# Patient Record
Sex: Female | Born: 1985 | Race: Black or African American | Hispanic: No | Marital: Single | Smoking: Current every day smoker
Health system: Southern US, Community
[De-identification: ages and names within clinical notes are randomized; demographics above are authoritative.]

## PROBLEM LIST (undated history)

## (undated) DIAGNOSIS — I10 Essential (primary) hypertension: Secondary | ICD-10-CM

---
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
## 2012-05-02 DIAGNOSIS — R0602 Shortness of breath: Secondary | ICD-10-CM

## 2017-11-17 ENCOUNTER — Emergency Department (HOSPITAL_COMMUNITY): Payer: Self-pay

## 2017-11-17 ENCOUNTER — Other Ambulatory Visit: Payer: Self-pay

## 2017-11-17 ENCOUNTER — Encounter (HOSPITAL_COMMUNITY): Payer: Self-pay

## 2017-11-17 ENCOUNTER — Emergency Department (HOSPITAL_COMMUNITY)
Admission: EM | Admit: 2017-11-17 | Discharge: 2017-11-17 | Disposition: A | Discharge status: Home / Self Care | Payer: Self-pay | Attending: Emergency Medicine | Admitting: Emergency Medicine

## 2017-11-17 DIAGNOSIS — R51 Headache: Secondary | ICD-10-CM | POA: Insufficient documentation

## 2017-11-17 DIAGNOSIS — I10 Essential (primary) hypertension: Secondary | ICD-10-CM | POA: Insufficient documentation

## 2017-11-17 DIAGNOSIS — F17210 Nicotine dependence, cigarettes, uncomplicated: Secondary | ICD-10-CM | POA: Insufficient documentation

## 2017-11-17 DIAGNOSIS — Y999 Unspecified external cause status: Secondary | ICD-10-CM | POA: Insufficient documentation

## 2017-11-17 DIAGNOSIS — Y9389 Activity, other specified: Secondary | ICD-10-CM | POA: Insufficient documentation

## 2017-11-17 DIAGNOSIS — R519 Headache, unspecified: Secondary | ICD-10-CM

## 2017-11-17 HISTORY — DX: Essential (primary) hypertension: I10

## 2017-11-17 NOTE — Discharge Instructions (Signed)
It is nice meeting you today! The CAT scan of your head is normal.  You may take Tylenol for up to every 6 hours as needed for your pain. Please follow-up with your primary care doctor.  We strongly recommend you cut down on smoking. Please come back to emergency department if you have worsening of your headache or other symptoms concerning to you.
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
## 2017-11-17 NOTE — ED Provider Notes (Signed)
Pt seen and evaluated. D/W Resident. Pt with normal exam, and normal CT scan.  No stigmata of skull fracture.  No blood over the TMs, mastoids, or from ears nose or mouth.  Symmetric intact cranial nerves.  Minimal abrasion to the front forehead.  Normal gait.  Normal neuro exam.  This discharged home.  Conservative treatment.  Expectant management.  Over-the-counter pain medications.  Postconcussive symptoms discussed, and given in written form.   Rolland PorterJames, Sarye Kath, MD 11/17/17 1100

## 2018-08-18 ENCOUNTER — Emergency Department (HOSPITAL_COMMUNITY)
Admission: EM | Admit: 2018-08-18 | Discharge: 2018-08-18 | Disposition: A | Discharge status: Home / Self Care | Payer: PRIVATE HEALTH INSURANCE | Attending: Emergency Medicine | Admitting: Emergency Medicine

## 2018-08-18 ENCOUNTER — Other Ambulatory Visit: Payer: Self-pay

## 2018-08-18 ENCOUNTER — Encounter (HOSPITAL_COMMUNITY): Payer: Self-pay | Admitting: *Deleted

## 2018-08-18 ENCOUNTER — Emergency Department (HOSPITAL_COMMUNITY): Payer: PRIVATE HEALTH INSURANCE

## 2018-08-18 DIAGNOSIS — M545 Low back pain: Secondary | ICD-10-CM | POA: Diagnosis present

## 2018-08-18 DIAGNOSIS — M5441 Lumbago with sciatica, right side: Secondary | ICD-10-CM | POA: Insufficient documentation

## 2018-08-18 DIAGNOSIS — I10 Essential (primary) hypertension: Secondary | ICD-10-CM | POA: Diagnosis not present

## 2018-08-18 DIAGNOSIS — F17200 Nicotine dependence, unspecified, uncomplicated: Secondary | ICD-10-CM | POA: Insufficient documentation

## 2018-08-18 LAB — POC URINE PREG, ED: Preg Test, Ur: NEGATIVE

## 2018-08-18 MED ORDER — METHOCARBAMOL 500 MG PO TABS: 500.0000 mg | ORAL_TABLET | Freq: Two times a day (BID) | ORAL | 0 refills | Status: AC

## 2018-08-18 MED ORDER — IBUPROFEN 600 MG PO TABS: 600.0000 mg | ORAL_TABLET | Freq: Four times a day (QID) | ORAL | 0 refills | Status: DC | PRN

## 2018-08-18 MED ORDER — METHOCARBAMOL 500 MG PO TABS: 500.0000 mg | ORAL_TABLET | Freq: Two times a day (BID) | ORAL | 0 refills | Status: DC

## 2018-08-18 MED ORDER — LIDOCAINE 5 % EX PTCH: 1.0000 | MEDICATED_PATCH | CUTANEOUS | 0 refills | Status: AC

## 2018-08-18 MED ORDER — KETOROLAC TROMETHAMINE 60 MG/2ML IM SOLN
60.0000 mg | Freq: Once | INTRAMUSCULAR | Status: AC
  Administered 2018-08-18: 60 mg via INTRAMUSCULAR
  Filled 2018-08-18: qty 2

## 2018-08-18 MED ORDER — HYDROCODONE-ACETAMINOPHEN 5-325 MG PO TABS: 1.0000 | ORAL_TABLET | Freq: Four times a day (QID) | ORAL | 0 refills | Status: AC | PRN

## 2018-08-18 MED ORDER — LIDOCAINE 5 % EX PTCH: 1.0000 | MEDICATED_PATCH | CUTANEOUS | 0 refills | Status: DC

## 2018-08-18 MED ORDER — IBUPROFEN 600 MG PO TABS: 600.0000 mg | ORAL_TABLET | Freq: Four times a day (QID) | ORAL | 0 refills | Status: AC | PRN

## 2018-08-18 NOTE — ED Triage Notes (Signed)
Pt states she woke up and could not move.  Pt went to work and just could hardly move.  Complains of pain to lower mid back and radiates to right and down to right hip.  Pt has a pin to right hip.

## 2018-08-18 NOTE — ED Provider Notes (Signed)
[facility] Provider Note   CSN: [id number] Arrival date & time: 08/18/18  [id number]     History   Chief Complaint Chief Complaint  Patient presents with  . Back Pain    HPI Tricia Henderson is a 32 y.o. female.  HPI   Tricia Henderson is a 32 y.o. female, with a history of HTN, presenting to the ED with right lower back pain beginning this morning.  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  She woke up with pain in the right lower back radiating to the right hip, described as a stiffness, moderate to severe.  She has not taken anything for her symptoms this morning. States she got up and went to work, but decided she wanted to be evaluated.  Patient mentions that she has a pain in the right hip.  She states she had some right hip laxity from dancing for many years and a pin was placed to stabilize the right hip.  Patient is currently menstruating, starting yesterday.  Denies fever/chills, abdominal pain, N/V/C/D, numbness, weakness, urinary symptoms, falls/trauma, or any other complaints.    Past Medical History:  Diagnosis Date  . Hypertension     There are no active problems to display for this patient.   History reviewed. No pertinent surgical history.   OB History   No obstetric history on file.      Home Medications    Prior to Admission medications   Medication Sig Start Date End Date Taking? Authorizing Provider  HYDROcodone-acetaminophen (NORCO/VICODIN) 5-325 MG tablet Take 1 tablet by mouth every 6 (six) hours as needed for severe pain. 08/18/18   Joy, Shawn C, PA-C  ibuprofen (ADVIL,MOTRIN) 600 MG tablet Take 1 tablet (600 mg total) by mouth every 6 (six) hours as needed. 08/18/18   Joy, Shawn C, PA-C  lidocaine (LIDODERM) 5 % Place 1 patch onto the skin daily. Remove & Discard patch within 12 hours or as directed by MD 08/18/18   Joy, Shawn C, PA-C  methocarbamol  (ROBAXIN) 500 MG tablet Take 1 tablet (500 mg total) by mouth 2 (two) times daily. 08/18/18   Anselm PancoastJoy, Shawn C, PA-C    Family History No family history on file.  Social History Social History   Tobacco Use  . Smoking status: Current Every Day Smoker  . Smokeless tobacco: Never Used  Substance Use Topics  . Alcohol use: Yes  . Drug use: Yes    Types: Marijuana     Allergies   Patient has no known allergies.   Review of Systems Review of Systems  Constitutional: Negative for chills, diaphoresis and fever.  Respiratory: Negative for shortness of breath.   Cardiovascular: Negative for chest pain.  Gastrointestinal: Negative for abdominal pain, constipation, diarrhea, nausea and vomiting.  Genitourinary: Negative for dysuria, flank pain, frequency, hematuria and pelvic pain.  Musculoskeletal: Positive for back pain.  Neurological: Negative for weakness and numbness.  All other systems reviewed and are negative.    Physical Exam Updated Vital Signs BP (!) 119/48 (BP Location: Right Arm)   Pulse 71   Temp 98.3 F (36.8 C) (Oral)   Resp 14   Ht 5\' 5"  (1.651 m)   Wt 104.3 kg   LMP 08/17/2018 (Exact Date)   SpO2 99%   BMI 38.27 kg/m   Physical Exam Vitals signs and nursing note reviewed.  Constitutional:      General: She is not  in acute distress.    Appearance: She is well-developed. She is not diaphoretic.  HENT:     Head: Normocephalic and atraumatic.  Eyes:     Conjunctiva/sclera: Conjunctivae normal.  Neck:     Musculoskeletal: Neck supple.  Cardiovascular:     Rate and Rhythm: Normal rate and regular rhythm.     Pulses:          Posterior tibial pulses are 2+ on the right side and 2+ on the left side.  Pulmonary:     Effort: Pulmonary effort is normal. No respiratory distress.  Abdominal:     Palpations: Abdomen is soft.     Tenderness: There is no abdominal tenderness. There is no guarding.  Musculoskeletal:       Back:     Comments: Tenderness  throughout the musculature in the regions indicated. Patient has pain with range of motion of the right hip, but no noted deformity, laxity, swelling, or instability.  Lymphadenopathy:     Cervical: No cervical adenopathy.  Skin:    General: Skin is warm and dry.  Neurological:     Mental Status: She is alert.     Comments: Sensation grossly intact to light touch in the lower extremities bilaterally. No saddle anesthesias. Strength 5/5 with flexion and extension at the bilateral hips, knees, and ankles. Slow, antalgic gait, however, ambulates independently without assistance. Coordination intact with heel to shin testing.  Psychiatric:        Behavior: Behavior normal.      ED Treatments / Results  Labs (all labs ordered are listed, but only abnormal results are displayed) Labs Reviewed  POC URINE PREG, ED    EKG None  Radiology Dg Thoracic Spine 2 View  Result Date: 08/18/2018 CLINICAL DATA:  Acute upper back pain without known injury. EXAM: THORACIC SPINE 2 VIEWS COMPARISON:  CT scan of April 10, 2015. FINDINGS: There is no evidence of thoracic spine fracture. Alignment is normal. No other significant bone abnormalities are identified. IMPRESSION: Negative. Electronically Signed   By: Lupita Raider, M.D.   On: 08/18/2018 11:42   Dg Lumbar Spine Complete  Result Date: 08/18/2018 CLINICAL DATA:  Acute lower back pain without known injury. EXAM: LUMBAR SPINE - COMPLETE 4+ VIEW COMPARISON:  CT scan of February 14, 2016. FINDINGS: There is no evidence of lumbar spine fracture. Alignment is normal. Mild disc space narrowing of L4-5 is noted suggesting degenerative disc disease. 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 No acute abnormality seen in the lumbar spine. Electronically Signed   By: Lupita Raider, M.D.   On: 08/18/2018 11:41   Dg Hip Unilat W Or Wo Pelvis 2-3 Views Right  Result Date: 08/18/2018 CLINICAL DATA:  Right hip pain without recent injury. EXAM: DG  HIP (WITH OR WITHOUT PELVIS) 2-3V RIGHT COMPARISON:  None. FINDINGS: Status post surgical internal fixation of old right hip fracture. No acute fracture or dislocation is noted. Joint spaces intact. No significant degenerative changes noted. IMPRESSION: Postsurgical changes seen in right hip. No acute abnormality is noted. Electronically Signed   By: Lupita Raider, M.D.   On: 08/18/2018 11:39    Procedures Procedures (including critical care time)  Medications Ordered in ED Medications  ketorolac (TORADOL) injection 60 mg (60 mg Intramuscular Given 08/18/18 1016)     Initial Impression / Assessment and Plan / ED Course  I have reviewed the triage vital signs and the nursing notes.  Pertinent labs & imaging results that were  available during my care of the patient were reviewed by me and considered in my medical decision making (see chart for details).     Patient presents with lower back pain.  Neurovascularly intact.  No red flag symptoms.  No acute abnormalities on imaging. The patient was given instructions for home care as well as return precautions. Patient voices understanding of these instructions, accepts the plan, and is comfortable with discharge.    Final Clinical Impressions(s) / ED Diagnoses   Final diagnoses:  Acute right-sided low back pain with right-sided sciatica    ED Discharge Orders         Ordered    methocarbamol (ROBAXIN) 500 MG tablet  2 times daily     08/18/18 1010    lidocaine (LIDODERM) 5 %  Every 24 hours     08/18/18 1010    HYDROcodone-acetaminophen (NORCO/VICODIN) 5-325 MG tablet  Every 6 hours PRN     08/18/18 1010    ibuprofen (ADVIL,MOTRIN) 600 MG tablet  Every 6 hours PRN     08/18/18 1010           Anselm Pancoast, PA-C 08/18/18 1222    Wynetta Fines, MD 08/18/18 1642

## 2018-08-18 NOTE — Discharge Instructions (Addendum)
There were no acute abnormalities noted on x-rays, however, there were mild degenerative changes noted in the lumbar spine.  Expect your soreness to increase over the next 2-3 days. Take it easy, but do not lay around too much as this may make any stiffness worse.  Antiinflammatory medications: Take 600 mg of ibuprofen every 6 hours or 440 mg (over the counter dose) to 500 mg (prescription dose) of naproxen every 12 hours for the next 3 days. After this time, these medications may be used as needed for pain. Take these medications with food to avoid upset stomach. Choose only one of these medications, do not take them together. Acetaminophen (generic for Tylenol): Should you continue to have additional pain while taking the ibuprofen or naproxen, you may add in acetaminophen as needed. Your daily total maximum amount of acetaminophen from all sources should be limited to 4000mg /day for persons without liver problems, or 2000mg /day for those with liver problems. Vicodin: May take Vicodin (hydrocodone-acetaminophen) as needed for severe pain.  Do not drive or perform other dangerous activities while taking the Vicodin.  Please note that each pill of Vicodin contains 325 mg of acetaminophen (Tylenol) and the above dosage limits apply. Muscle relaxer: Robaxin is a muscle relaxer and may help loosen stiff muscles. Do not take the Robaxin while driving or performing other dangerous activities.  Lidocaine patches: These are available via either prescription or over-the-counter. The over-the-counter option may be more economical one and are likely just as effective. There are multiple over-the-counter brands, such as Salonpas. Exercises: Be sure to perform the attached exercises starting with three times a week and working up to performing them daily. This is an essential part of preventing long term problems.  Follow up: Follow up with a primary care provider for any future management of these complaints. Be sure to  follow up within 7-10 days.  May also need to follow-up with an orthopedist. Return: Return to the ED should symptoms worsen.  For prescription assistance, may try using prescription discount sites or apps, such as goodrx.com

## 2018-08-18 NOTE — ED Notes (Signed)
Pt stable, ambulatory, states understanding of discharge instructions 

## 2019-02-23 IMAGING — DX DG THORACIC SPINE 2V
2 series · 2 of 2 positions shown · non-contrast
Comparison: CT scan of April 10, 2015.

CLINICAL DATA: Acute upper back pain without known injury.

EXAM:
THORACIC SPINE 2 VIEWS

[t thoracic spine lat]
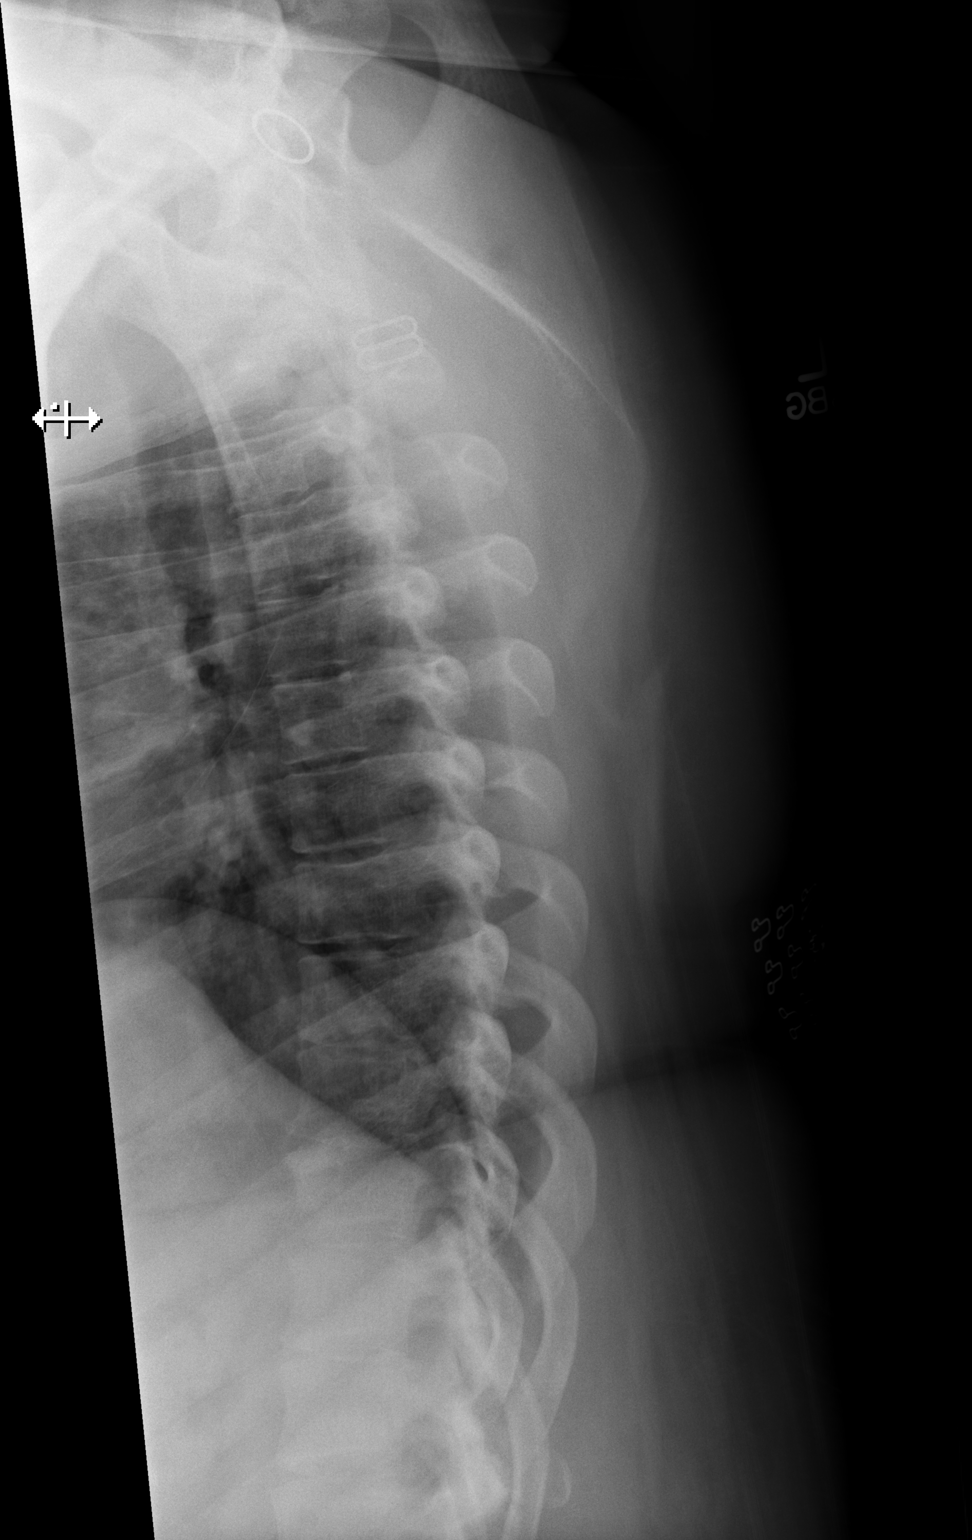

[t thoracic spine ap]
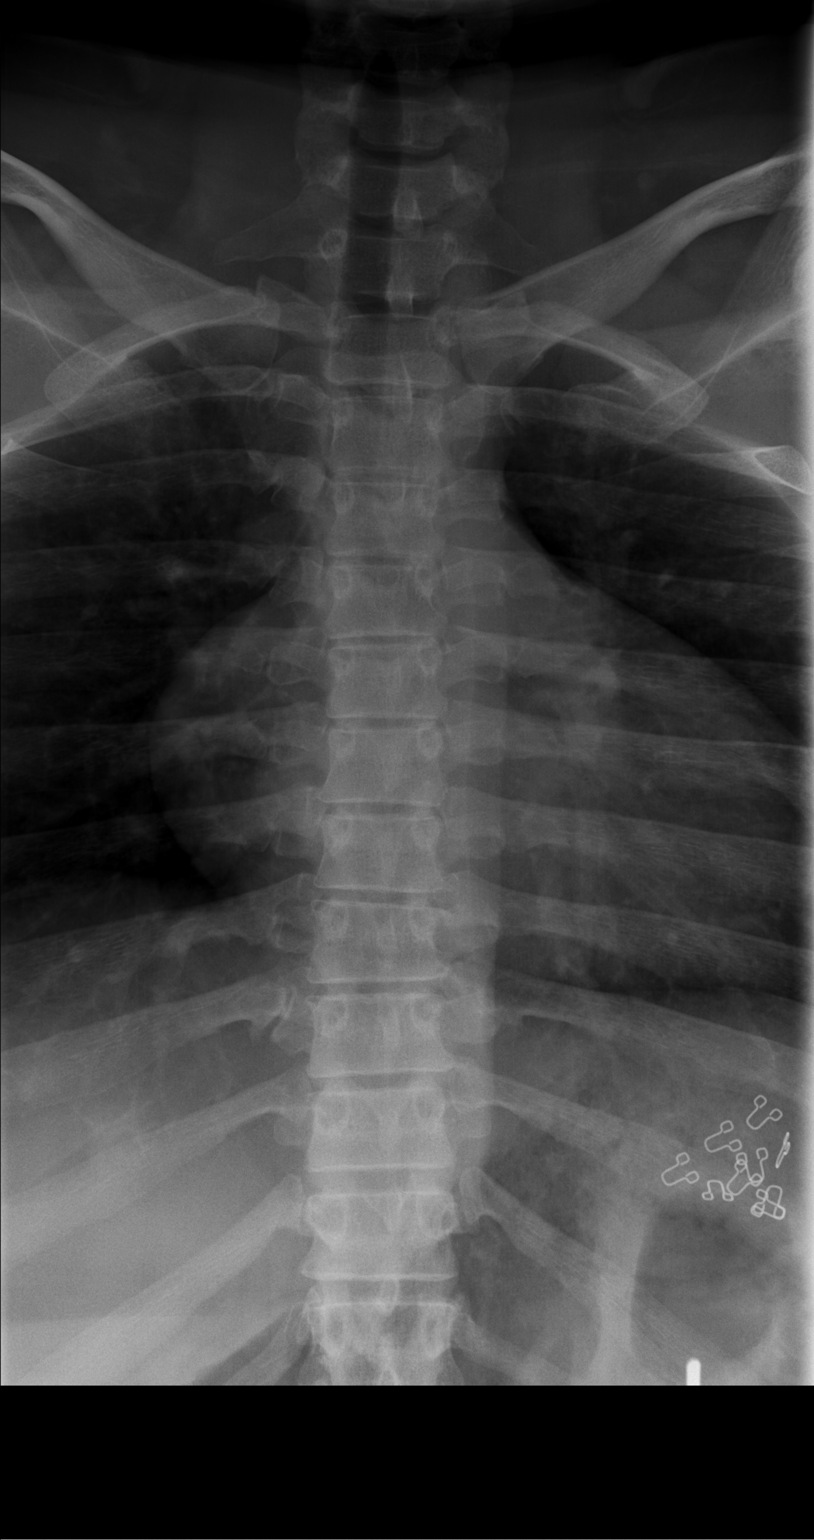

[2 of 2 positions shown; findings below may reference images not displayed]

FINDINGS: There is no evidence of thoracic spine fracture. Alignment is
normal. No other significant bone abnormalities are identified.
IMPRESSION: Negative.

## 2019-02-23 IMAGING — DX DG LUMBAR SPINE COMPLETE 4+V
5 series · 5 of 5 positions shown · non-contrast
Comparison: CT scan of February 14, 2016.

CLINICAL DATA: Acute lower back pain without known injury.

EXAM:
LUMBAR SPINE - COMPLETE 4+ VIEW

[t lumbar spine ap]
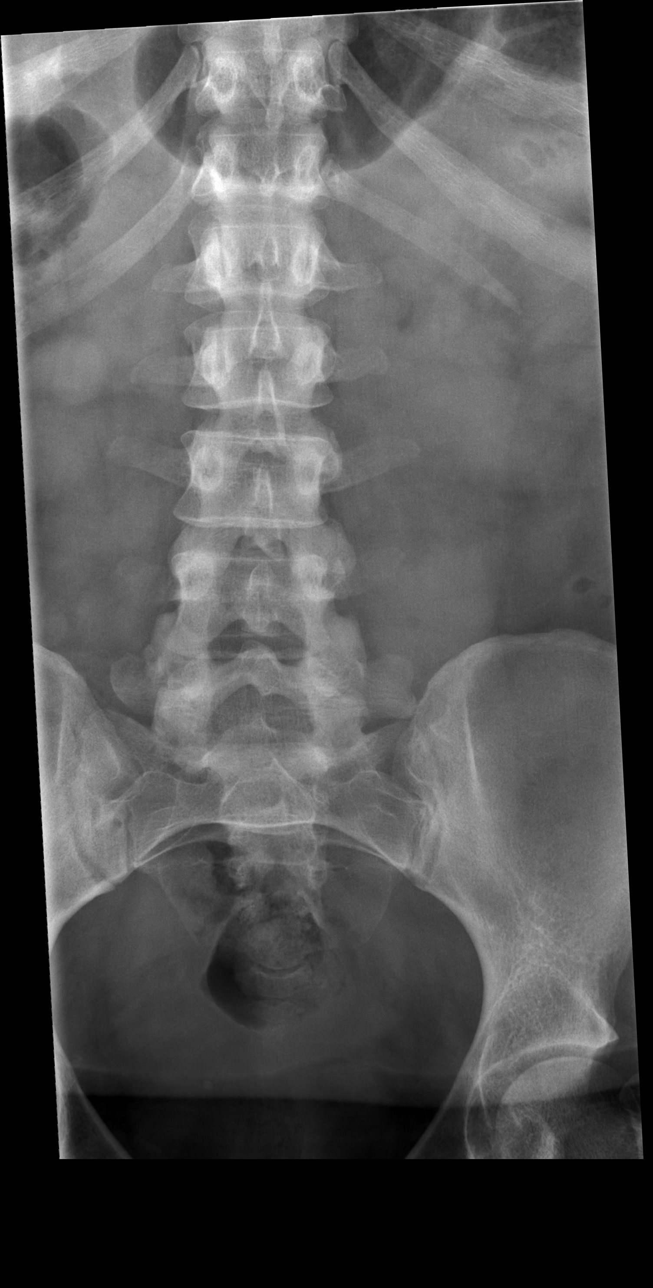

[t lumbar spine obl (1 of 2)]
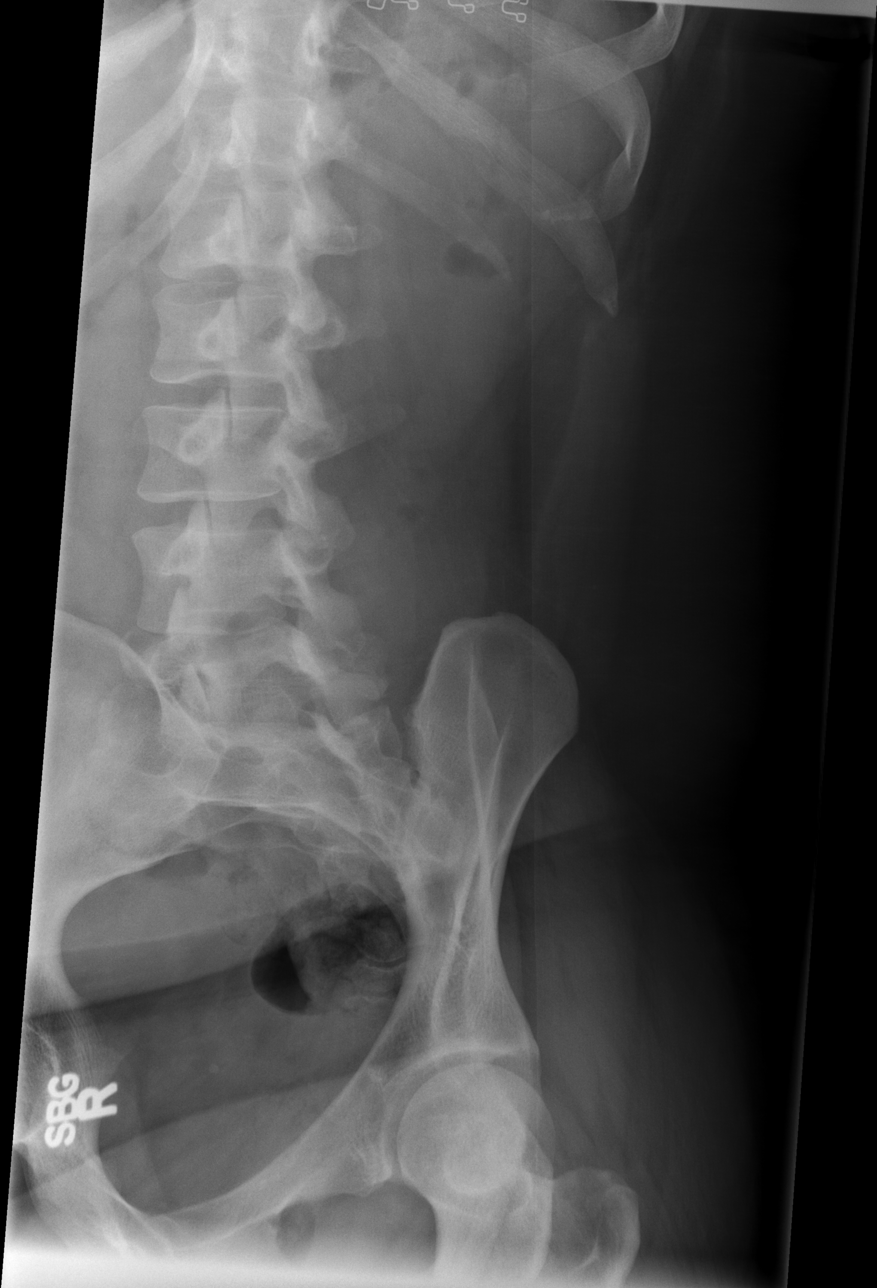

[t lumbar spine obl (2 of 2)]
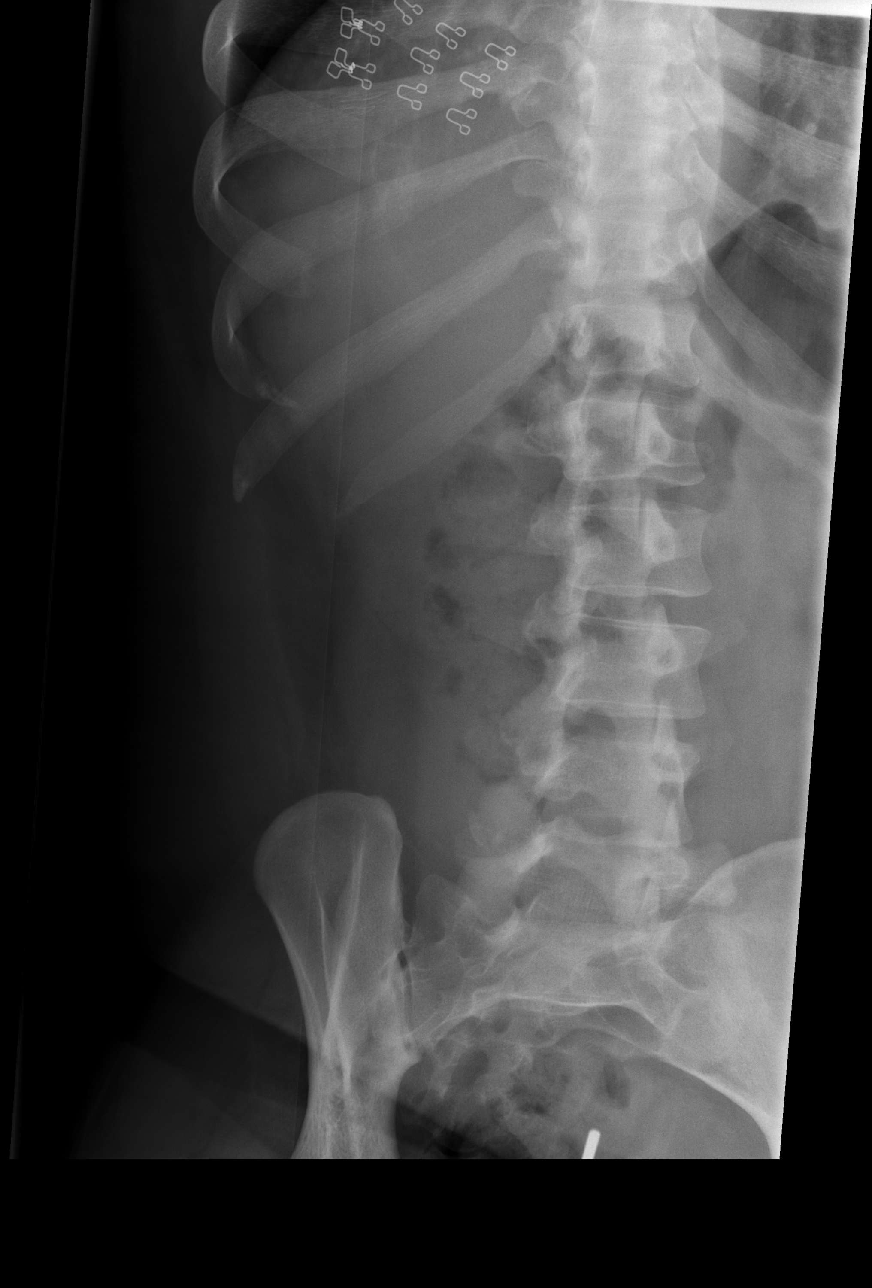

[t lumbar spine lat]
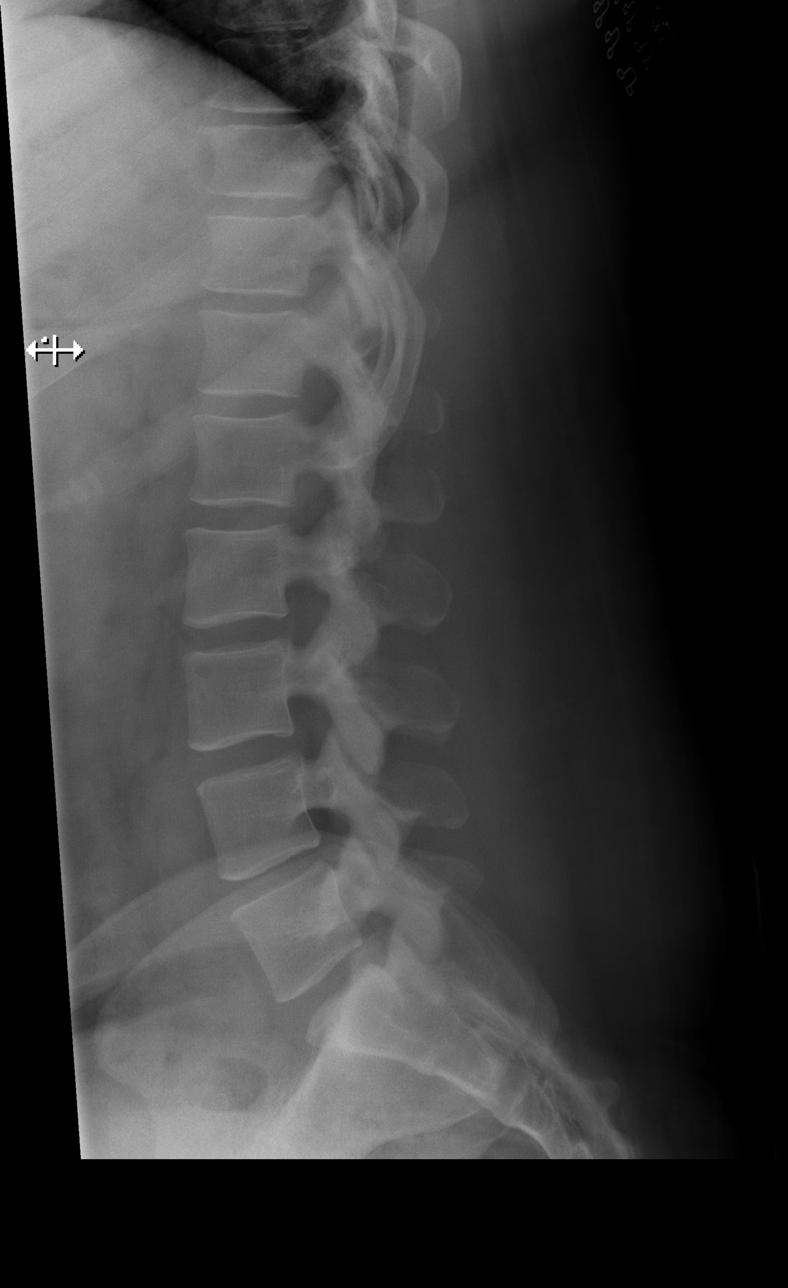

[t lumbar l-5 s-1 spot]
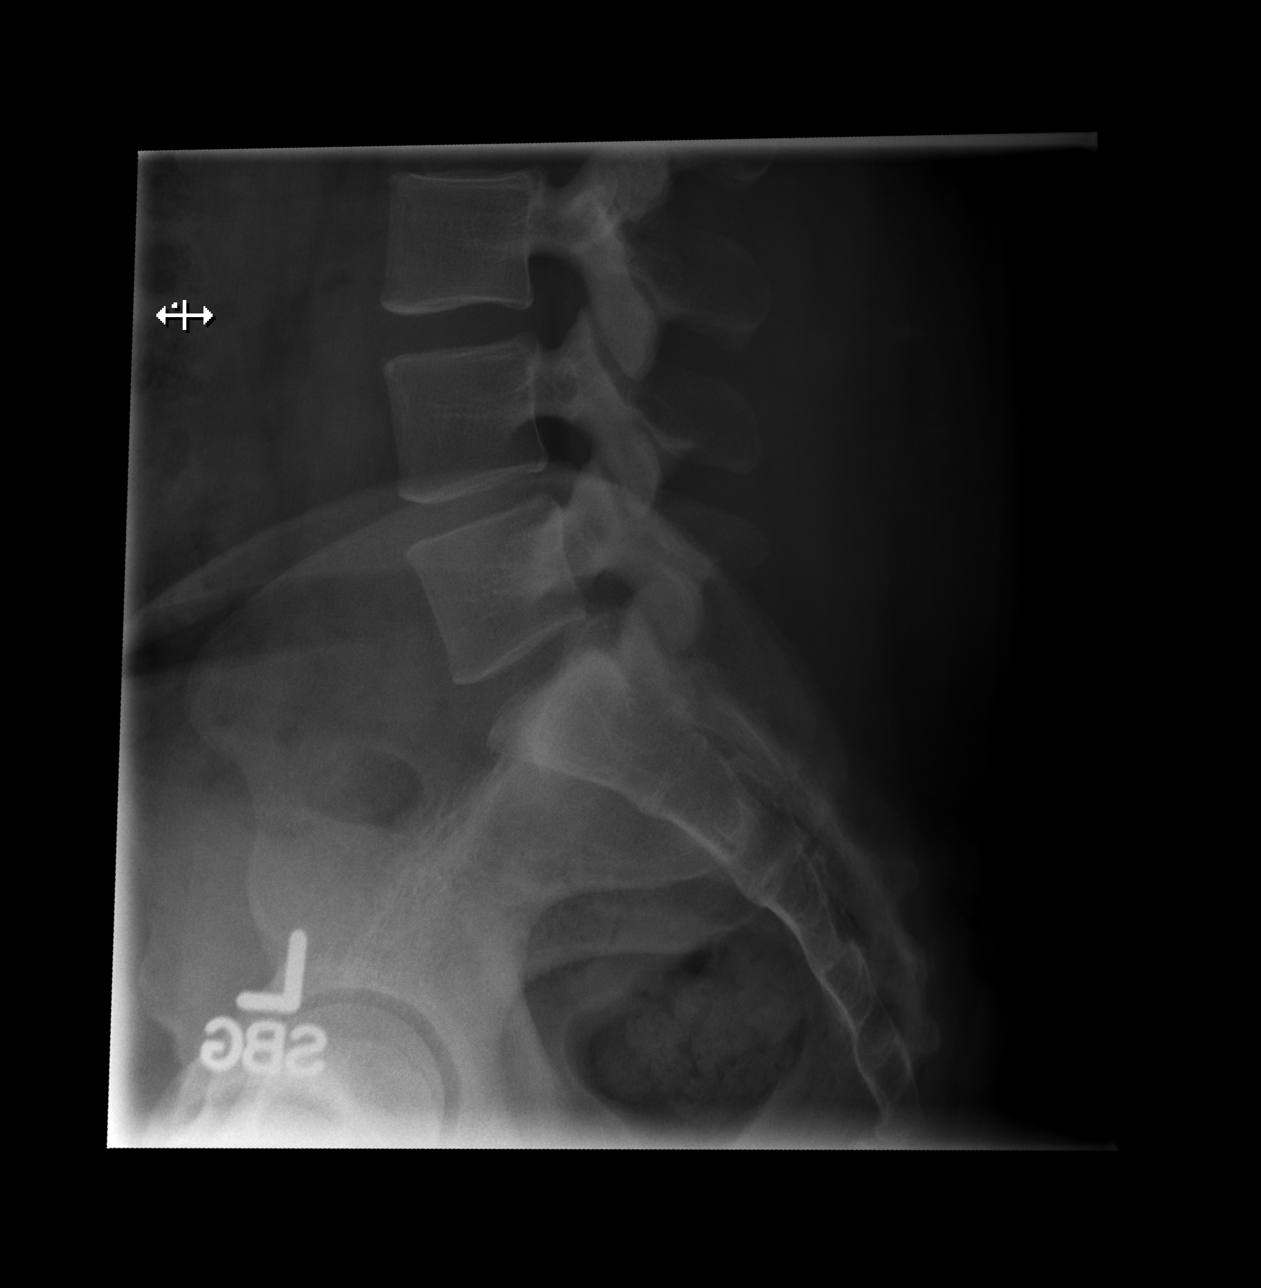

[5 of 5 positions shown; findings below may reference images not displayed]

FINDINGS: There is no evidence of lumbar spine fracture. Alignment is normal.
Mild disc space narrowing of L4-5 is noted suggesting degenerative
disc disease.
IMPRESSION: Mild degenerative disc disease of L4-5. No acute abnormality seen in
the lumbar spine.

## 2021-12-07 DIAGNOSIS — X503XXA Overexertion from repetitive movements, initial encounter: Secondary | ICD-10-CM | POA: Diagnosis not present

## 2021-12-07 DIAGNOSIS — Y990 Civilian activity done for income or pay: Secondary | ICD-10-CM | POA: Diagnosis not present

## 2021-12-07 DIAGNOSIS — S39012A Strain of muscle, fascia and tendon of lower back, initial encounter: Secondary | ICD-10-CM | POA: Diagnosis not present

## 2021-12-07 DIAGNOSIS — M545 Low back pain, unspecified: Secondary | ICD-10-CM | POA: Diagnosis not present

## 2021-12-07 DIAGNOSIS — F17210 Nicotine dependence, cigarettes, uncomplicated: Secondary | ICD-10-CM | POA: Diagnosis not present

## 2021-12-07 DIAGNOSIS — R030 Elevated blood-pressure reading, without diagnosis of hypertension: Secondary | ICD-10-CM | POA: Diagnosis not present

## 2021-12-19 DIAGNOSIS — I10 Essential (primary) hypertension: Secondary | ICD-10-CM | POA: Diagnosis not present

## 2021-12-19 DIAGNOSIS — M5442 Lumbago with sciatica, left side: Secondary | ICD-10-CM | POA: Diagnosis not present

## 2021-12-19 DIAGNOSIS — E6609 Other obesity due to excess calories: Secondary | ICD-10-CM | POA: Diagnosis not present

## 2021-12-19 DIAGNOSIS — E782 Mixed hyperlipidemia: Secondary | ICD-10-CM | POA: Diagnosis not present

## 2022-03-20 DIAGNOSIS — Z6835 Body mass index (BMI) 35.0-35.9, adult: Secondary | ICD-10-CM | POA: Diagnosis not present

## 2022-03-20 DIAGNOSIS — Z0000 Encounter for general adult medical examination without abnormal findings: Secondary | ICD-10-CM | POA: Diagnosis not present

## 2022-03-26 DIAGNOSIS — S0993XA Unspecified injury of face, initial encounter: Secondary | ICD-10-CM | POA: Diagnosis not present

## 2022-03-26 DIAGNOSIS — S0990XA Unspecified injury of head, initial encounter: Secondary | ICD-10-CM | POA: Diagnosis not present

## 2022-03-30 DIAGNOSIS — Z4802 Encounter for removal of sutures: Secondary | ICD-10-CM | POA: Diagnosis not present

## 2022-04-14 ENCOUNTER — Other Ambulatory Visit: Payer: Self-pay

## 2022-04-14 ENCOUNTER — Emergency Department (HOSPITAL_COMMUNITY): Payer: BC Managed Care – PPO

## 2022-04-14 ENCOUNTER — Emergency Department (HOSPITAL_COMMUNITY)
Admission: EM | Admit: 2022-04-14 | Discharge: 2022-04-14 | Disposition: A | Discharge status: Home / Self Care | Payer: BC Managed Care – PPO | Attending: Emergency Medicine | Admitting: Emergency Medicine

## 2022-04-14 ENCOUNTER — Encounter (HOSPITAL_COMMUNITY): Payer: Self-pay | Admitting: *Deleted

## 2022-04-14 DIAGNOSIS — I493 Ventricular premature depolarization: Secondary | ICD-10-CM | POA: Insufficient documentation

## 2022-04-14 DIAGNOSIS — R002 Palpitations: Secondary | ICD-10-CM | POA: Diagnosis present

## 2022-04-14 LAB — BASIC METABOLIC PANEL
Anion gap: 7 (ref 5–15)
BUN: 7 mg/dL (ref 6–20)
CO2: 24 mmol/L (ref 22–32)
Calcium: 9 mg/dL (ref 8.9–10.3)
Chloride: 112 mmol/L — ABNORMAL HIGH (ref 98–111)
Creatinine, Ser: 0.86 mg/dL (ref 0.44–1.00)
GFR, Estimated: >60 mL/min (ref >60)
Glucose, Bld: 88 mg/dL (ref 70–99)
Potassium: 3.9 mmol/L (ref 3.5–5.1)
Sodium: 143 mmol/L (ref 135–145)

## 2022-04-14 LAB — CBC
HCT: 36.3 % (ref 36.0–46.0)
Hemoglobin: 12.1 g/dL (ref 12.0–15.0)
MCH: 30 pg (ref 26.0–34.0)
MCHC: 33.3 g/dL (ref 30.0–36.0)
MCV: 90.1 fL (ref 80.0–100.0)
Platelets: 364 10*3/uL (ref 150–400)
RBC: 4.03 MIL/uL (ref 3.87–5.11)
RDW: 12.5 % (ref 11.5–15.5)
WBC: 8 10*3/uL (ref 4.0–10.5)
nRBC: 0 % (ref 0.0–0.2)

## 2022-04-14 LAB — TROPONIN I (HIGH SENSITIVITY)
Troponin I (High Sensitivity): 4 ng/L (ref <18)
Troponin I (High Sensitivity): 4 ng/L (ref <18)

## 2022-04-14 NOTE — ED Triage Notes (Signed)
The pt has been feeling weird today  irregular any way but its worse today   no pain  lmp  3 weeks ago

## 2022-04-14 NOTE — ED Provider Triage Note (Signed)
Emergency Medicine Provider Triage Evaluation Note  Tricia Henderson , a 36 y.o. female  was evaluated in triage.  Pt complains of chest palpitations.  Patient reports that she has a history of chest palpitations, has never seen cardiology.  The patient reports that the last 2 days these palpitations have increased in frequency.  The patient states that when they occur, it causes her to become short of breath and anxious.  The patient reports that she has not had any kind of syncopal events, denies any lightheadedness or dizziness.  Patient does have audible arrhythmia on auscultation.  Review of Systems  Positive:  Negative:   Physical Exam  BP (!) 141/98   Pulse (!) 109   Temp 98.7 F (37.1 C) (Oral)   Resp 18   Ht 5\' 5"  (1.651 m)   Wt 104.3 kg   LMP 03/23/2022   SpO2 99%   BMI 38.26 kg/m  Gen:   Awake, no distress   Resp:  Normal effort  MSK:   Moves extremities without difficulty  Other:  Lung sounds are bilaterally.  Audible arrhythmia  Medical Decision Making  Medically screening exam initiated at 4:54 PM.  Appropriate orders placed.  Jasmaine Hedges was informed that the remainder of the evaluation will be completed by another provider, this initial triage assessment does not replace that evaluation, and the importance of remaining in the ED until their evaluation is complete.     03/25/2022, PA-C 04/14/22 1655

## 2022-04-14 NOTE — Discharge Instructions (Addendum)
Your palpitations are likely what are call PVC, premature beats.  This can be exacerbated by caffeine use and tobacco use.  I do recommend cutting back on your caffeine use. If you are still having symptoms please let your primary care provider.  I have also placed a consult to cardiology to get Holter monitor. They should call to schedule an appointment. Please let your primary care doctor know you were seen here in the emergency department on Monday.  Return for new or worsening symptoms.

## 2022-04-14 NOTE — ED Provider Notes (Signed)
[facility] EMERGENCY DEPARTMENT Provider Note   CSN: [id number] Arrival date & time: 04/14/22  1623    History  Chief Complaint  Patient presents with   Chest Pain    Tricia Henderson is a 36 y.o. female for evaluation of palpitations.  Patient states this has been going on for the past few years however have worsened over the past few weeks.  Feels like her heart races and possibly skips beats. No CP, SOB, LE swelling.  No surgery, immobilization or malignancy.  No known thyroid problems.  Does drink large amount of caffeine on a daily basis of [facility] and coffee.  She has no current symptoms.  No fever, emesis, back pain, nausea or vomiting.  HPI     Home Medications Prior to Admission medications   Medication Sig Start Date End Date Taking? Authorizing Provider  HYDROcodone-acetaminophen (NORCO/VICODIN) 5-325 MG tablet Take 1 tablet by mouth every 6 (six) hours as needed for severe pain. 08/18/18   Joy, Shawn C, PA-C  ibuprofen (ADVIL,MOTRIN) 600 MG tablet Take 1 tablet (600 mg total) by mouth every 6 (six) hours as needed. 08/18/18   Joy, Shawn C, PA-C  lidocaine (LIDODERM) 5 % Place 1 patch onto the skin daily. Remove & Discard patch within 12 hours or as directed by MD 08/18/18   Joy, Shawn C, PA-C  methocarbamol (ROBAXIN) 500 MG tablet Take 1 tablet (500 mg total) by mouth 2 (two) times daily. 08/18/18   Joy, Hillard Danker, PA-C      Allergies    Patient has no known allergies.    Review of Systems   Review of Systems  Constitutional: Negative.   HENT: Negative.    Respiratory: Negative.    Cardiovascular:  Positive for palpitations. Negative for chest pain and leg swelling.  Gastrointestinal: Negative.   Genitourinary: Negative.   Musculoskeletal: Negative.   Skin: Negative.   Neurological: Negative.   All other systems reviewed and are negative.   Physical Exam Updated Vital Signs BP 122/68 (BP Location: Left Arm)   Pulse 79   Temp 99.4 F (37.4  C) (Oral)   Resp 16   Ht 5\' 5"  (1.651 m)   Wt 104.3 kg   LMP 03/23/2022   SpO2 100%   BMI 38.26 kg/m  Physical Exam Vitals and nursing note reviewed.  Constitutional:      General: She is not in acute distress.    Appearance: She is well-developed. She is obese. She is not ill-appearing, toxic-appearing or diaphoretic.  HENT:     Head: Normocephalic and atraumatic.  Eyes:     Pupils: Pupils are equal, round, and reactive to light.  Cardiovascular:     Rate and Rhythm: Normal rate.     Pulses:          Radial pulses are 2+ on the right side and 2+ on the left side.       Dorsalis pedis pulses are 2+ on the right side and 2+ on the left side.     Heart sounds: Normal heart sounds.  Pulmonary:     Effort: Pulmonary effort is normal. No respiratory distress.     Breath sounds: Normal breath sounds.     Comments: Clear bilaterally, speaks in full sentences without difficulty Chest:     Comments: Non tender chest wall Abdominal:     General: Bowel sounds are normal. There is no distension.     Palpations: Abdomen is soft.     Comments:  Soft, non tender without rebound or guarding  Musculoskeletal:        General: Normal range of motion.     Cervical back: Normal range of motion.     Right lower leg: No tenderness. No edema.     Left lower leg: No tenderness. No edema.     Comments: No bony tenderness, compartments soft. Homans neg  Skin:    General: Skin is warm and dry.     Capillary Refill: Capillary refill takes less than 2 seconds.     Comments: No erythema, warmth  Neurological:     General: No focal deficit present.     Mental Status: She is alert and oriented to person, place, and time.     Comments: Ambulatory Intact sensation  Psychiatric:        Mood and Affect: Mood normal.    ED Results / Procedures / Treatments   Labs (all labs ordered are listed, but only abnormal results are displayed) Labs Reviewed  BASIC METABOLIC PANEL - Abnormal; Notable for the  following components:      Result Value   Chloride 112 (*)    All other components within normal limits  CBC  TSH  TROPONIN I (HIGH SENSITIVITY)  TROPONIN I (HIGH SENSITIVITY)    EKG EKG Interpretation  Date/Time:  Friday April 14 2022 16:50:07 EDT Ventricular Rate:  110 PR Interval:  132 QRS Duration: 76 QT Interval:  326 QTC Calculation: 441 R Axis:   71 Text Interpretation: Sinus tachycardia with frequent and consecutive Premature ventricular complexes Abnormal ECG No previous ECGs available Confirmed by Dorie Rank [phone]) on 04/14/2022 10:19:44 PM  Radiology DG Chest 1 View  Result Date: 04/14/2022 CLINICAL DATA:  Heart beating fast. EXAM: CHEST  1 VIEW COMPARISON:  None Available. FINDINGS: The heart size and mediastinal contours are within normal limits. Both lungs are clear. The visualized skeletal structures are unremarkable. IMPRESSION: No active disease. Electronically Signed   By: Ronney Asters M.D.   On: 04/14/2022 17:19    Procedures Procedures    Medications Ordered in ED Medications - No data to display  ED Course/ Medical Decision Making/ A&P Clinical Course as of 04/14/22 2309  Fri Apr 14, 2022  2218 CBC [JK]  2218 Normal [JK]  2218 Troponin I (High Sensitivity) Normal [JK]  2218 DG Chest 1 View No acute abnormalities [JK]    Clinical Course User Index [JK] Dorie Rank, MD    36 year old here for evaluation of palpitations.  Sounds like these have been ongoing for quite some time however worse over the last week.  She has PVCs on her EKG.  She has no risk factors for PE.  No chest pain, shortness of breath.  No history of thyroid problems.  She is currently asymptomatic.  She does drink large amounts of caffeine with [facility] and coffee daily > 40 oz at least of [facility] daily.  We will obtain thyroid studies.  She states she will follow-up with this with her PCP.  She likely needs Holter monitor.  Will place ambulatory referral to cardiology.   I encouraged cessation of tobacco and caffeine use to see if this helps in the meantime.  Labs and imaging personally viewed and interpreted:  Troponin flat CBC without leukocytosis BMP without significant abnormality Chest xray without cardiomegaly, pulm edema, pneumothorax, PNA TSH pending  Discussed results with patient.  Encourage close follow-up with cardiology and PCP.  Patient agreeable.  She will return for  new or worsening symptoms.  Low suspicion for acute ACS, PE, dissection.  The patient has been appropriately medically screened and/or stabilized in the ED. I have low suspicion for any other emergent medical condition which would require further screening, evaluation or treatment in the ED or require inpatient management.  Patient is hemodynamically stable and in no acute distress.  Patient able to ambulate in department prior to ED.  Evaluation does not show acute pathology that would require ongoing or additional emergent interventions while in the emergency department or further inpatient treatment.  I have discussed the diagnosis with the patient and answered all questions.  Pain is been managed while in the emergency department and patient has no further complaints prior to discharge.  Patient is comfortable with plan discussed in room and is stable for discharge at this time.  I have discussed strict return precautions for returning to the emergency department.  Patient was encouraged to follow-up with PCP/specialist refer to at discharge.                            Medical Decision Making Amount and/or Complexity of Data Reviewed External Data Reviewed: labs, radiology, ECG and notes. Labs: ordered. Decision-making details documented in ED Course. Radiology: ordered and independent interpretation performed. Decision-making details documented in ED Course. ECG/medicine tests: ordered and independent interpretation performed. Decision-making details documented in ED  Course.  Risk OTC drugs. Prescription drug management. Decision regarding hospitalization. Diagnosis or treatment significantly limited by social determinants of health.          Final Clinical Impression(s) / ED Diagnoses Final diagnoses:  Palpitations  PVC (premature ventricular contraction)    Rx / DC Orders ED Discharge Orders          Ordered    Ambulatory referral to Cardiology       Comments: If you have not heard from the Cardiology office within the next 72 hours please call [phone].   04/14/22 2247              Lorina Duffner A, PA-C 04/14/22 2310    Linwood Dibbles, MD 04/15/22 1510

## 2022-04-15 LAB — TSH: TSH: 0.589 u[IU]/mL (ref 0.350–4.500)
